# Patient Record
Sex: Male | Born: 1940 | Race: White | Hispanic: No | Smoking: Current every day smoker
Health system: Southern US, Community
[De-identification: ages and names within clinical notes are randomized; demographics above are authoritative.]

## PROBLEM LIST (undated history)

## (undated) DIAGNOSIS — I639 Cerebral infarction, unspecified: Secondary | ICD-10-CM

## (undated) DIAGNOSIS — J449 Chronic obstructive pulmonary disease, unspecified: Secondary | ICD-10-CM

## (undated) HISTORY — PX: MOHS SURGERY: SUR867

---

## 2008-12-17 ENCOUNTER — Emergency Department: Payer: Self-pay | Admitting: Emergency Medicine

## 2008-12-25 ENCOUNTER — Ambulatory Visit: Payer: Self-pay | Admitting: Surgery

## 2008-12-27 ENCOUNTER — Ambulatory Visit: Payer: Self-pay | Admitting: Surgery

## 2010-01-13 ENCOUNTER — Emergency Department: Payer: Self-pay | Admitting: Emergency Medicine

## 2010-09-07 ENCOUNTER — Ambulatory Visit: Payer: Self-pay | Admitting: Internal Medicine

## 2010-09-23 ENCOUNTER — Ambulatory Visit: Payer: Self-pay | Admitting: Internal Medicine

## 2011-08-20 ENCOUNTER — Emergency Department: Payer: Self-pay | Admitting: Emergency Medicine

## 2011-08-20 LAB — URINALYSIS, COMPLETE
Bacteria: NONE SEEN
Bilirubin,UR: NEGATIVE
Glucose,UR: NEGATIVE mg/dL (ref 0–75)
Ketone: NEGATIVE
Leukocyte Esterase: NEGATIVE
Protein: NEGATIVE
RBC,UR: NONE SEEN /HPF (ref 0–5)
Specific Gravity: 1.001 (ref 1.003–1.030)
Squamous Epithelial: NONE SEEN
WBC UR: NONE SEEN /HPF (ref 0–5)

## 2011-08-20 LAB — CBC WITH DIFFERENTIAL/PLATELET
Basophil #: 0.3 10*3/uL — ABNORMAL HIGH (ref 0.0–0.1)
HCT: 46.4 % (ref 40.0–52.0)
HGB: 15.2 g/dL (ref 13.0–18.0)
Lymphocyte #: 3.7 10*3/uL — ABNORMAL HIGH (ref 1.0–3.6)
Lymphocyte %: 33.2 %
MCH: 32.9 pg (ref 26.0–34.0)
MCV: 101 fL — ABNORMAL HIGH (ref 80–100)
Monocyte #: 1.1 x10 3/mm — ABNORMAL HIGH (ref 0.2–1.0)
Monocyte %: 10 %
Neutrophil #: 5.7 10*3/uL (ref 1.4–6.5)
Platelet: 333 10*3/uL (ref 150–440)
RDW: 13 % (ref 11.5–14.5)
WBC: 11.1 10*3/uL — ABNORMAL HIGH (ref 3.8–10.6)

## 2011-08-20 LAB — CK TOTAL AND CKMB (NOT AT ARMC)
CK, Total: 46 U/L (ref 35–232)
CK-MB: <0.5 ng/mL — ABNORMAL LOW (ref 0.5–3.6)

## 2011-08-20 LAB — COMPREHENSIVE METABOLIC PANEL
Albumin: 3.9 g/dL (ref 3.4–5.0)
Alkaline Phosphatase: 104 U/L (ref 50–136)
Anion Gap: 11 (ref 7–16)
Calcium, Total: 8.3 mg/dL — ABNORMAL LOW (ref 8.5–10.1)
EGFR (African American): >60
Osmolality: 276 (ref 275–301)
SGOT(AST): 53 U/L — ABNORMAL HIGH (ref 15–37)
SGPT (ALT): 40 U/L
Sodium: 140 mmol/L (ref 136–145)

## 2011-08-20 LAB — TROPONIN I: Troponin-I: <0.02 ng/mL

## 2011-08-20 LAB — PROTIME-INR: Prothrombin Time: 12.4 secs (ref 11.5–14.7)

## 2014-08-16 ENCOUNTER — Ambulatory Visit
Admission: RE | Admit: 2014-08-16 | Discharge: 2014-08-16 | Disposition: A | Discharge status: Home / Self Care | Payer: Medicare Other | Source: Ambulatory Visit | Attending: Internal Medicine | Admitting: Internal Medicine

## 2014-08-16 ENCOUNTER — Other Ambulatory Visit: Payer: Self-pay | Admitting: Internal Medicine

## 2014-08-16 DIAGNOSIS — R059 Cough, unspecified: Secondary | ICD-10-CM

## 2014-08-16 DIAGNOSIS — J449 Chronic obstructive pulmonary disease, unspecified: Secondary | ICD-10-CM | POA: Diagnosis present

## 2014-08-16 DIAGNOSIS — Z8709 Personal history of other diseases of the respiratory system: Secondary | ICD-10-CM

## 2014-08-16 DIAGNOSIS — F17200 Nicotine dependence, unspecified, uncomplicated: Secondary | ICD-10-CM | POA: Diagnosis present

## 2014-08-16 DIAGNOSIS — R05 Cough: Secondary | ICD-10-CM

## 2014-09-03 ENCOUNTER — Other Ambulatory Visit: Payer: Self-pay | Admitting: Internal Medicine

## 2014-09-03 DIAGNOSIS — T578X1A Toxic effect of other specified inorganic substances, accidental (unintentional), initial encounter: Secondary | ICD-10-CM

## 2014-09-04 ENCOUNTER — Other Ambulatory Visit: Payer: Self-pay | Admitting: Physician Assistant

## 2014-09-04 DIAGNOSIS — T578X1A Toxic effect of other specified inorganic substances, accidental (unintentional), initial encounter: Secondary | ICD-10-CM

## 2014-09-06 ENCOUNTER — Ambulatory Visit
Admission: RE | Admit: 2014-09-06 | Discharge: 2014-09-06 | Disposition: A | Discharge status: Home / Self Care | Payer: Medicare Other | Source: Ambulatory Visit | Attending: Internal Medicine | Admitting: Internal Medicine

## 2014-09-06 ENCOUNTER — Ambulatory Visit: Admit: 2014-09-06 | Payer: Self-pay

## 2014-09-06 ENCOUNTER — Other Ambulatory Visit
Admission: RE | Admit: 2014-09-06 | Discharge: 2014-09-06 | Disposition: A | Discharge status: Home / Self Care | Payer: Medicare Other | Source: Ambulatory Visit | Attending: Physician Assistant | Admitting: Physician Assistant

## 2014-09-06 DIAGNOSIS — T578X1A Toxic effect of other specified inorganic substances, accidental (unintentional), initial encounter: Secondary | ICD-10-CM

## 2014-09-06 DIAGNOSIS — I2510 Atherosclerotic heart disease of native coronary artery without angina pectoris: Secondary | ICD-10-CM | POA: Diagnosis not present

## 2014-09-06 DIAGNOSIS — J439 Emphysema, unspecified: Secondary | ICD-10-CM | POA: Insufficient documentation

## 2014-09-06 DIAGNOSIS — R0602 Shortness of breath: Secondary | ICD-10-CM | POA: Diagnosis present

## 2014-09-06 LAB — CREATININE, SERUM: CREATININE: 1.02 mg/dL (ref 0.61–1.24)

## 2014-09-06 MED ORDER — IOHEXOL 300 MG/ML  SOLN
75.0000 mL | Freq: Once | INTRAMUSCULAR | Status: AC | PRN
  Administered 2014-09-06: 75 mL via INTRAVENOUS

## 2014-10-04 ENCOUNTER — Other Ambulatory Visit: Payer: Self-pay | Admitting: Physician Assistant

## 2014-10-04 DIAGNOSIS — R0602 Shortness of breath: Secondary | ICD-10-CM

## 2014-10-08 ENCOUNTER — Ambulatory Visit: Admission: RE | Admit: 2014-10-08 | Payer: Medicare Other | Source: Ambulatory Visit

## 2014-10-19 ENCOUNTER — Ambulatory Visit
Admission: RE | Admit: 2014-10-19 | Discharge: 2014-10-19 | Disposition: A | Discharge status: Home / Self Care | Payer: Medicare Other | Source: Ambulatory Visit | Attending: Physician Assistant | Admitting: Physician Assistant

## 2014-10-19 DIAGNOSIS — R0602 Shortness of breath: Secondary | ICD-10-CM

## 2014-10-19 MED ORDER — REGADENOSON 0.4 MG/5ML IV SOLN
0.4000 mg | Freq: Once | INTRAVENOUS | Status: DC
  Filled 2014-10-19: qty 5

## 2014-10-19 MED ORDER — TECHNETIUM TC 99M SESTAMIBI - CARDIOLITE: 30.0000 | Freq: Once | INTRAVENOUS | Status: AC | PRN

## 2014-10-19 MED ORDER — TECHNETIUM TC 99M SESTAMIBI - CARDIOLITE: 13.0000 | Freq: Once | INTRAVENOUS | Status: AC | PRN

## 2014-10-25 ENCOUNTER — Encounter
Admission: RE | Admit: 2014-10-25 | Discharge: 2014-10-25 | Disposition: A | Discharge status: Home / Self Care | Payer: Medicare Other | Source: Ambulatory Visit | Attending: Physician Assistant | Admitting: Physician Assistant

## 2014-10-25 DIAGNOSIS — R0602 Shortness of breath: Secondary | ICD-10-CM | POA: Diagnosis present

## 2014-10-25 DIAGNOSIS — I2510 Atherosclerotic heart disease of native coronary artery without angina pectoris: Secondary | ICD-10-CM | POA: Insufficient documentation

## 2014-10-25 HISTORY — DX: Cerebral infarction, unspecified: I63.9

## 2014-10-25 MED ORDER — REGADENOSON 0.4 MG/5ML IV SOLN
0.4000 mg | Freq: Once | INTRAVENOUS | Status: DC
  Filled 2014-10-25: qty 5

## 2014-10-25 MED ORDER — TECHNETIUM TC 99M SESTAMIBI - CARDIOLITE
10.0000 | Freq: Once | INTRAVENOUS | Status: AC | PRN
  Administered 2014-10-25: 13.61 via INTRAVENOUS

## 2014-10-25 MED ORDER — TECHNETIUM TC 99M SESTAMIBI - CARDIOLITE
30.0000 | Freq: Once | INTRAVENOUS | Status: AC | PRN
  Administered 2014-10-25: 10:00:00 32.15 via INTRAVENOUS

## 2014-11-12 LAB — NM MYOCAR MULTI W/SPECT W/WALL MOTION / EF
CHL CUP NUCLEAR SDS: 3
LVDIAVOL: 49 mL
LVSYSVOL: 20 mL
SRS: 1
SSS: 3
TID: 1

## 2017-03-09 ENCOUNTER — Ambulatory Visit: Payer: Self-pay | Admitting: Nurse Practitioner

## 2019-04-23 ENCOUNTER — Emergency Department: Payer: Medicare Other

## 2019-04-23 ENCOUNTER — Other Ambulatory Visit: Payer: Self-pay

## 2019-04-23 ENCOUNTER — Inpatient Hospital Stay
Admission: EM | Admit: 2019-04-23 | Discharge: 2019-05-15 | DRG: 871 | Disposition: E | Payer: Medicare Other | Attending: Internal Medicine | Admitting: Internal Medicine

## 2019-04-23 ENCOUNTER — Inpatient Hospital Stay: Payer: Medicare Other

## 2019-04-23 DIAGNOSIS — E860 Dehydration: Secondary | ICD-10-CM | POA: Diagnosis present

## 2019-04-23 DIAGNOSIS — J189 Pneumonia, unspecified organism: Secondary | ICD-10-CM | POA: Diagnosis present

## 2019-04-23 DIAGNOSIS — I69354 Hemiplegia and hemiparesis following cerebral infarction affecting left non-dominant side: Secondary | ICD-10-CM

## 2019-04-23 DIAGNOSIS — I469 Cardiac arrest, cause unspecified: Secondary | ICD-10-CM | POA: Diagnosis not present

## 2019-04-23 DIAGNOSIS — J441 Chronic obstructive pulmonary disease with (acute) exacerbation: Secondary | ICD-10-CM | POA: Diagnosis present

## 2019-04-23 DIAGNOSIS — A419 Sepsis, unspecified organism: Secondary | ICD-10-CM | POA: Diagnosis present

## 2019-04-23 DIAGNOSIS — N179 Acute kidney failure, unspecified: Secondary | ICD-10-CM | POA: Diagnosis present

## 2019-04-23 DIAGNOSIS — Z20822 Contact with and (suspected) exposure to covid-19: Secondary | ICD-10-CM | POA: Diagnosis present

## 2019-04-23 DIAGNOSIS — I4581 Long QT syndrome: Secondary | ICD-10-CM | POA: Diagnosis present

## 2019-04-23 DIAGNOSIS — R6520 Severe sepsis without septic shock: Secondary | ICD-10-CM | POA: Diagnosis present

## 2019-04-23 DIAGNOSIS — Z79899 Other long term (current) drug therapy: Secondary | ICD-10-CM | POA: Diagnosis not present

## 2019-04-23 DIAGNOSIS — J440 Chronic obstructive pulmonary disease with acute lower respiratory infection: Secondary | ICD-10-CM | POA: Diagnosis present

## 2019-04-23 DIAGNOSIS — F17210 Nicotine dependence, cigarettes, uncomplicated: Secondary | ICD-10-CM | POA: Diagnosis present

## 2019-04-23 DIAGNOSIS — R0602 Shortness of breath: Secondary | ICD-10-CM | POA: Diagnosis present

## 2019-04-23 DIAGNOSIS — J9602 Acute respiratory failure with hypercapnia: Secondary | ICD-10-CM | POA: Diagnosis present

## 2019-04-23 DIAGNOSIS — J9601 Acute respiratory failure with hypoxia: Secondary | ICD-10-CM | POA: Diagnosis present

## 2019-04-23 HISTORY — DX: Chronic obstructive pulmonary disease, unspecified: J44.9

## 2019-04-23 LAB — BLOOD GAS, VENOUS
Acid-base deficit: 0.4 mmol/L (ref 0.0–2.0)
Bicarbonate: 25.9 mmol/L (ref 20.0–28.0)
O2 Saturation: 71.2 %
Patient temperature: 37
pCO2, Ven: 48 mmHg (ref 44.0–60.0)
pH, Ven: 7.34 (ref 7.250–7.430)
pO2, Ven: 40 mmHg (ref 32.0–45.0)

## 2019-04-23 LAB — APTT: aPTT: 33 seconds (ref 24–36)

## 2019-04-23 LAB — COMPREHENSIVE METABOLIC PANEL
ALT: 25 U/L (ref 0–44)
AST: 60 U/L — ABNORMAL HIGH (ref 15–41)
Albumin: 2.4 g/dL — ABNORMAL LOW (ref 3.5–5.0)
Alkaline Phosphatase: 85 U/L (ref 38–126)
Anion gap: 20 — ABNORMAL HIGH (ref 5–15)
BUN: 51 mg/dL — ABNORMAL HIGH (ref 8–23)
CO2: 22 mmol/L (ref 22–32)
Calcium: 9.1 mg/dL (ref 8.9–10.3)
Chloride: 93 mmol/L — ABNORMAL LOW (ref 98–111)
Creatinine, Ser: 1.32 mg/dL — ABNORMAL HIGH (ref 0.61–1.24)
GFR calc Af Amer: 59 mL/min — ABNORMAL LOW (ref >60)
GFR calc non Af Amer: 51 mL/min — ABNORMAL LOW (ref >60)
Glucose, Bld: 172 mg/dL — ABNORMAL HIGH (ref 70–99)
Potassium: 4.4 mmol/L (ref 3.5–5.1)
Sodium: 135 mmol/L (ref 135–145)
Total Bilirubin: 0.8 mg/dL (ref 0.3–1.2)
Total Protein: 8 g/dL (ref 6.5–8.1)

## 2019-04-23 LAB — FIBRIN DERIVATIVES D-DIMER (ARMC ONLY): Fibrin derivatives D-dimer (ARMC): 1507.99 ng/mL (FEU) — ABNORMAL HIGH (ref 0.00–499.00)

## 2019-04-23 LAB — BLOOD GAS, ARTERIAL
Acid-base deficit: 16.8 mmol/L — ABNORMAL HIGH (ref 0.0–2.0)
Allens test (pass/fail): POSITIVE — AB
Bicarbonate: 13.8 mmol/L — ABNORMAL LOW (ref 20.0–28.0)
FIO2: 100
MECHVT: 500 mL
O2 Saturation: 99.9 %
PEEP: 10 cmH2O
Patient temperature: 37
RATE: 20 resp/min
pCO2 arterial: 51 mmHg — ABNORMAL HIGH (ref 32.0–48.0)
pH, Arterial: 7.04 — CL (ref 7.350–7.450)
pO2, Arterial: 359 mmHg — ABNORMAL HIGH (ref 83.0–108.0)

## 2019-04-23 LAB — CBC WITH DIFFERENTIAL/PLATELET
Abs Immature Granulocytes: 0.19 10*3/uL — ABNORMAL HIGH (ref 0.00–0.07)
Basophils Absolute: 0 10*3/uL (ref 0.0–0.1)
Basophils Relative: 0 %
Eosinophils Absolute: 0.1 10*3/uL (ref 0.0–0.5)
Eosinophils Relative: 1 %
HCT: 43.8 % (ref 39.0–52.0)
Hemoglobin: 14.8 g/dL (ref 13.0–17.0)
Immature Granulocytes: 1 %
Lymphocytes Relative: 3 %
Lymphs Abs: 0.6 10*3/uL — ABNORMAL LOW (ref 0.7–4.0)
MCH: 31.5 pg (ref 26.0–34.0)
MCHC: 33.8 g/dL (ref 30.0–36.0)
MCV: 93.2 fL (ref 80.0–100.0)
Monocytes Absolute: 1.2 10*3/uL — ABNORMAL HIGH (ref 0.1–1.0)
Monocytes Relative: 5 %
Neutro Abs: 21.6 10*3/uL — ABNORMAL HIGH (ref 1.7–7.7)
Neutrophils Relative %: 90 %
Platelets: 707 10*3/uL — ABNORMAL HIGH (ref 150–400)
RBC: 4.7 MIL/uL (ref 4.22–5.81)
RDW: 13 % (ref 11.5–15.5)
Smear Review: NORMAL
WBC: 23.8 10*3/uL — ABNORMAL HIGH (ref 4.0–10.5)
nRBC: 0 % (ref 0.0–0.2)

## 2019-04-23 LAB — FERRITIN: Ferritin: 294 ng/mL (ref 24–336)

## 2019-04-23 LAB — FIBRINOGEN: Fibrinogen: >750 mg/dL — ABNORMAL HIGH (ref 210–475)

## 2019-04-23 LAB — RESPIRATORY PANEL BY RT PCR (FLU A&B, COVID)
Influenza A by PCR: NEGATIVE
Influenza B by PCR: NEGATIVE
SARS Coronavirus 2 by RT PCR: NEGATIVE

## 2019-04-23 LAB — POC SARS CORONAVIRUS 2 AG: SARS Coronavirus 2 Ag: NEGATIVE

## 2019-04-23 LAB — PROCALCITONIN: Procalcitonin: 6.22 ng/mL

## 2019-04-23 LAB — LACTIC ACID, PLASMA
Lactic Acid, Venous: 5.7 mmol/L (ref 0.5–1.9)
Lactic Acid, Venous: 3.2 mmol/L (ref 0.5–1.9)

## 2019-04-23 LAB — PROTIME-INR
INR: 1.2 (ref 0.8–1.2)
Prothrombin Time: 14.6 seconds (ref 11.4–15.2)

## 2019-04-23 LAB — C-REACTIVE PROTEIN: CRP: 22.6 mg/dL — ABNORMAL HIGH (ref <1.0)

## 2019-04-23 MED ORDER — FENTANYL CITRATE (PF) 100 MCG/2ML IJ SOLN: 25.0000 ug | Freq: Once | INTRAMUSCULAR | Status: DC

## 2019-04-23 MED ORDER — VECURONIUM BROMIDE 10 MG IV SOLR: 10.0000 mg | INTRAVENOUS | Status: DC | PRN

## 2019-04-23 MED ORDER — IPRATROPIUM-ALBUTEROL 0.5-2.5 (3) MG/3ML IN SOLN
3.0000 mL | Freq: Four times a day (QID) | RESPIRATORY_TRACT | Status: DC
  Administered 2019-04-23: 3 mL via RESPIRATORY_TRACT
  Filled 2019-04-23: qty 3

## 2019-04-23 MED ORDER — CHLORHEXIDINE GLUCONATE 0.12% ORAL RINSE (MEDLINE KIT): 15.0000 mL | Freq: Two times a day (BID) | OROMUCOSAL | Status: DC

## 2019-04-23 MED ORDER — POLYETHYLENE GLYCOL 3350 17 G PO PACK: 17.0000 g | PACK | Freq: Every day | ORAL | Status: DC | PRN

## 2019-04-23 MED ORDER — IPRATROPIUM-ALBUTEROL 0.5-2.5 (3) MG/3ML IN SOLN: 3.0000 mL | RESPIRATORY_TRACT | Status: DC

## 2019-04-23 MED ORDER — ONDANSETRON HCL 4 MG PO TABS: 4.0000 mg | ORAL_TABLET | Freq: Four times a day (QID) | ORAL | Status: DC | PRN

## 2019-04-23 MED ORDER — IOHEXOL 350 MG/ML SOLN
75.0000 mL | Freq: Once | INTRAVENOUS | Status: AC | PRN
  Administered 2019-04-23: 75 mL via INTRAVENOUS

## 2019-04-23 MED ORDER — FENTANYL 2500MCG IN NS 250ML (10MCG/ML) PREMIX INFUSION: 25.0000 ug/h | INTRAVENOUS | Status: DC

## 2019-04-23 MED ORDER — SODIUM CHLORIDE 0.9 % IV SOLN
2.0000 g | INTRAVENOUS | Status: DC
  Administered 2019-04-23: 2 g via INTRAVENOUS
  Filled 2019-04-23 (×2): qty 20

## 2019-04-23 MED ORDER — FENTANYL BOLUS VIA INFUSION
25.0000 ug | INTRAVENOUS | Status: DC | PRN
  Filled 2019-04-23: qty 25

## 2019-04-23 MED ORDER — ENOXAPARIN SODIUM 40 MG/0.4ML ~~LOC~~ SOLN: 40.0000 mg | SUBCUTANEOUS | Status: DC

## 2019-04-23 MED ORDER — ORAL CARE MOUTH RINSE: 15.0000 mL | OROMUCOSAL | Status: DC

## 2019-04-23 MED ORDER — SODIUM CHLORIDE 0.9 % IV BOLUS
1000.0000 mL | Freq: Once | INTRAVENOUS | Status: AC
  Administered 2019-04-23: 1000 mL via INTRAVENOUS

## 2019-04-23 MED ORDER — ASPIRIN EC 81 MG PO TBEC
81.0000 mg | DELAYED_RELEASE_TABLET | Freq: Every day | ORAL | Status: DC
  Administered 2019-04-23: 81 mg via ORAL
  Filled 2019-04-23: qty 1

## 2019-04-23 MED ORDER — ACETAMINOPHEN 325 MG PO TABS: 650.0000 mg | ORAL_TABLET | Freq: Four times a day (QID) | ORAL | Status: DC | PRN

## 2019-04-23 MED ORDER — IOHEXOL 300 MG/ML  SOLN: 75.0000 mL | Freq: Once | INTRAMUSCULAR | Status: DC | PRN

## 2019-04-23 MED ORDER — ACETAMINOPHEN 650 MG RE SUPP: 650.0000 mg | Freq: Four times a day (QID) | RECTAL | Status: DC | PRN

## 2019-04-23 MED ORDER — ONDANSETRON HCL 4 MG/2ML IJ SOLN: 4.0000 mg | Freq: Four times a day (QID) | INTRAMUSCULAR | Status: DC | PRN

## 2019-04-23 MED ORDER — FAMOTIDINE IN NACL 20-0.9 MG/50ML-% IV SOLN: 20.0000 mg | Freq: Two times a day (BID) | INTRAVENOUS | Status: DC

## 2019-04-23 MED ORDER — SODIUM CHLORIDE 0.9 % IV SOLN: INTRAVENOUS | Status: DC

## 2019-04-23 MED ORDER — PROPOFOL 1000 MG/100ML IV EMUL
INTRAVENOUS | Status: AC
  Administered 2019-04-23: 20 ug/kg/min via INTRAVENOUS
  Filled 2019-04-23: qty 100

## 2019-04-23 MED ORDER — SUCCINYLCHOLINE CHLORIDE 20 MG/ML IJ SOLN
INTRAMUSCULAR | Status: DC | PRN
  Administered 2019-04-23: 100 mg via INTRAVENOUS

## 2019-04-23 MED ORDER — BUDESONIDE 0.5 MG/2ML IN SUSP: 0.5000 mg | Freq: Two times a day (BID) | RESPIRATORY_TRACT | Status: DC

## 2019-04-23 MED ORDER — SODIUM CHLORIDE 0.9 % IV SOLN
500.0000 mg | INTRAVENOUS | Status: DC
  Administered 2019-04-23: 500 mg via INTRAVENOUS
  Filled 2019-04-23 (×2): qty 500

## 2019-04-23 MED ORDER — LORAZEPAM 2 MG/ML IJ SOLN: 2.0000 mg | INTRAMUSCULAR | Status: DC | PRN

## 2019-04-23 MED ORDER — ETOMIDATE 2 MG/ML IV SOLN
INTRAVENOUS | Status: DC | PRN
  Administered 2019-04-23: 20 mg via INTRAVENOUS

## 2019-04-23 MED ORDER — METHYLPREDNISOLONE SODIUM SUCC 40 MG IJ SOLR: 40.0000 mg | Freq: Two times a day (BID) | INTRAMUSCULAR | Status: DC

## 2019-04-23 MED ORDER — PROPOFOL 1000 MG/100ML IV EMUL: 5.0000 ug/kg/min | INTRAVENOUS | Status: DC

## 2019-04-25 LAB — CULTURE, BLOOD (ROUTINE X 2): Special Requests: ADEQUATE

## 2019-04-28 LAB — CULTURE, BLOOD (ROUTINE X 2): Culture: NO GROWTH

## 2019-05-15 NOTE — Progress Notes (Signed)
CODE SEPSIS - PHARMACY COMMUNICATION  **Broad Spectrum Antibiotics should be administered within 1 hour of Sepsis diagnosis**  Time Code Sepsis Called/Page Received: 3662  Antibiotics Ordered: Rocephin/azithro  Time of 1st antibiotic administration: 1225 (antibiotics ordered 1145)  Additional action taken by pharmacy: NA  If necessary, Name of Provider/Nurse Contacted: NA    Pricilla Riffle ,PharmD Clinical Pharmacist  04-30-19  12:30 PM

## 2019-05-15 NOTE — Progress Notes (Signed)
   May 11, 2019 1756  Clinical Encounter Type  Visited With Patient and family together  Visit Type Initial  Referral From Nurse  Consult/Referral To Chaplain  Spiritual Encounters  Spiritual Needs Prayer;Emotional;Grief support  [facility] received page for patient who died shortly after arriving on floor. [place] met pt's daughter in hallway and walked with her to the deceased room. CH assisted in bringing other members of support system to room as well. Family was grieving appropriately. Family chose [facility] in [place]. Provided pastoral care through [employer] of presence, validating feelings and prayer upon request. No further needs at this time.

## 2019-05-15 NOTE — ED Provider Notes (Addendum)
[facility] Emergency Department Provider Note  Time seen: 9:56 AM  I have reviewed the triage vital signs and the nursing notes.   HISTORY  Chief Complaint Respiratory Distress   HPI Jamie Hopkins is a 79 y.o. male with a past medical history of COPD presents to the emergency department for worsening shortness of breath and cough.  According to the patient over the past 2 weeks or so has had progressively worsening shortness of breath which has acutely worsened over the past 24 hours.  EMS states upon arrival they placed the patient on 6 L of oxygen satting in the low 90s on 6 L.  No reported fever.  Patient states he has had an increased cough.  Audible rhonchi.  Patient denies any chest pain or abdominal pain.  Sitting upright in bed moderate respiratory distress quite tachypneic.  Past Medical History:  Diagnosis Date  . Stroke    hiastory of mini strokes, left side weakened    There are no problems to display for this patient.   Prior to Admission medications   Not on File    No Known Allergies  No family history on file.  Social History Social History   Tobacco Use  . Smoking status: Current Every Day Smoker    Packs/day: 1.00    Years: 60.00    Pack years: 60.00  Substance Use Topics  . Alcohol use: Yes    Alcohol/week: 10.0 standard drinks    Types: 10 Cans of beer per week  . Drug use: Not on file    Review of Systems Constitutional: Negative for fever. Cardiovascular: Negative for chest pain. Respiratory: Positive for shortness of breath.  Positive for cough. Gastrointestinal: Negative for abdominal pain Musculoskeletal: Negative for leg swelling Neurological: Negative for headache All other ROS negative  ____________________________________________   PHYSICAL EXAM:  VITAL SIGNS: ED Triage Vitals  Enc Vitals Group     BP Apr 26, 2019 0948 (!) 147/66     Pulse Rate 2019-04-26 0948 (!) 142     Resp April 26, 2019 0948 (!) 38      Temp Apr 26, 2019 0948 98.9 F (37.2 C)     Temp Source 04/26/2019 0948 Oral     SpO2 Apr 26, 2019 0948 96 %     Weight April 26, 2019 0953 159 lb (72.1 kg)     Height 04/26/2019 0953 6\' 1"  (1.854 m)     Head Circumference --      Peak Flow --      Pain Score 26-Apr-2019 0952 9     Pain Loc --      Pain Edu? --      Excl. in GC? --    Constitutional: Patient is awake alert oriented, moderate respiratory distress sitting upright in bed with audible rhonchi. Eyes: Normal exam ENT      Head: Normocephalic and atraumatic.      Mouth/Throat: Mucous membranes are moist. Cardiovascular: Regular rhythm rate around 150 bpm.  No obvious murmur. Respiratory: Moderate tachypnea with mild to moderate distress sitting upright in bed.  Rhonchi auscultated bilaterally. Gastrointestinal: Soft and nontender. No distention. Musculoskeletal: Nontender with normal range of motion in all extremities. No lower extremity tenderness or edema. Neurologic:  Normal speech and language. No gross focal neurologic deficits  Skin:  Skin is warm, dry and intact.  Psychiatric: Mood and affect are normal.   ____________________________________________    EKG  EKG viewed and interpreted by myself shows what appears to be a sinus tachycardia around 144 bpm  with a narrow QRS, normal axis, normal intervals besides slight QTC prolongation nonspecific ST changes.  ____________________________________________    RADIOLOGY  Chest x-ray shows right upper lobe consolidation, cannot rule out mass we will obtain CT to further evaluate  ____________________________________________   INITIAL IMPRESSION / ASSESSMENT AND PLAN / ED COURSE  Pertinent labs & imaging results that were available during my care of the patient were reviewed by me and considered in my medical decision making (see chart for details).   Patient presents to the emergency department with moderate respiratory distress sitting upright in bed with audible rhonchi.   Patient received 125 mg of Solu-Medrol, 2 duo nebs and 2 g of magnesium by EMS prior to arrival.  We will continue to closely monitor in the emergency department.  We will check labs including blood cultures we will obtain a portable chest x-ray, perform a rapid Covid swab.  Given the patient's moderate respiratory distress audible rhonchi patient placed on BiPAP upon arrival.  Patient is afebrile in the emergency department.  Labs show significant leukocytosis consistent with sepsis given elevated respiratory rate and heart rate.  I have ordered broad-spectrum antibiotics to cover the patient.  I have also ordered a CT scan of the chest to rule out mass and to better evaluate the consolidation.  Rapid/PCR Covid test pending.  Patient admitted to the hospital service.  ----------------------------------------- 3:51 PM on 05-06-19 -----------------------------------------  Patient has acutely decompensated, has become more tachypneic, less responsive unable to answer questions or follow commands.  Placed back on BiPAP while preparing intubation.  Patient intubated via RSI without complication.  We will repeat a chest x-ray to evaluate as the patient continues to have a low oxygen saturation 61% however not a great waveform.  We will obtain an ABG.  I was able to clearly visualize the tube going between the vocal cords, equal breath sounds, bagging well with good color change.    Jamie Hopkins was evaluated in Emergency Department on 05/06/2019 for the symptoms described in the history of present illness. He was evaluated in the context of the global COVID-19 pandemic, which necessitated consideration that the patient might be at risk for infection with the SARS-CoV-2 virus that causes COVID-19. Institutional protocols and algorithms that pertain to the evaluation of patients at risk for COVID-19 are in a state of rapid change based on information released by regulatory bodies including the CDC and  federal and state organizations. These policies and algorithms were followed during the patient's care in the ED.  CRITICAL CARE Performed by: [facility]   Total critical care time: 60 minutes  Critical care time was exclusive of separately billable procedures and treating other patients.  Critical care was necessary to treat or prevent imminent or life-threatening deterioration.  Critical care was time spent personally by me on the following activities: development of treatment plan with patient and/or surrogate as well as nursing, discussions with consultants, evaluation of patient's response to treatment, examination of patient, obtaining history from patient or surrogate, ordering and performing treatments and interventions, ordering and review of laboratory studies, ordering and review of radiographic studies, pulse oximetry and re-evaluation of patient's condition.  INTUBATION Performed by: [facility]  Required items: required blood products, implants, devices, and special equipment available Patient identity confirmed: provided demographic data and hospital-assigned identification number Time out: Immediately prior to procedure a "time out" was called to verify the correct patient, procedure, equipment, support staff and site/side marked as required.  Indications: Airway failure  Intubation method: S4 Glidescope Laryngoscopy   Preoxygenation: 100 %BVM  Sedatives: 20mg  Etomidate Paralytic: 100mg  Succinylcholine  Tube Size: 7.5 cuffed  Post-procedure assessment: chest rise and ETCO2 monitor Breath sounds: equal and absent over the epigastrium Tube secured with: ETT holder  Patient tolerated the procedure well with no immediate complications.     ____________________________________________   FINAL CLINICAL IMPRESSION(S) / ED DIAGNOSES  Pneumonia Respiratory failure Sepsis   , MD 2019/05/05 1201    Minna Antis,  MD [id number]

## 2019-05-15 NOTE — Progress Notes (Signed)
Assisted with patient transfer to and from CT scan and once back in room patient placed on 3l [place] to assess off of bipap.

## 2019-05-15 NOTE — ED Notes (Signed)
Pt oxygen level on 3L was 88%. Increased to 6 L , will continue to monitor.

## 2019-05-15 NOTE — ED Notes (Signed)
Upon removing pt pants, pt had on jeans, long johns, and boxers. Brown/black stool noted to boxers and on pt. Pt cleaned with wipes. Strong ammonia smell. Cleaned with wipes front and back. Skin breakdown and redness noted to scrotum.   Pt other clothes- 2 pairs of socks, 3 shirts. All clothes placed in 2 belongings bags.

## 2019-05-15 NOTE — ED Notes (Signed)
This RN and RT are taking pt to CT room 1 at this time.

## 2019-05-15 NOTE — ED Notes (Signed)
Pt placed on bipap upon arrival. Pt using accessory muscles, labored breathing, breathing about 40 times a minute.

## 2019-05-15 NOTE — ED Notes (Signed)
Pt being placed back on bipap at this time.

## 2019-05-15 NOTE — ED Notes (Signed)
Admitting MD at bedside.

## 2019-05-15 NOTE — ED Triage Notes (Signed)
Pt arrives [facility] from home with resp distress. Hx COPD. Was 92% RA. Received 3 dounebs, 125mg  solumedrol, 2g magnesium. Pt was placed on 6 L [place] d/t dropping sats enroute. Arrives A&O. Coughing present, denies fever.

## 2019-05-15 NOTE — ED Notes (Signed)
Date and time results received: 05/10/19 1105  Test: lactic acid Critical Value: 5.7  Name of Provider Notified: Dr. Lenard Lance

## 2019-05-15 NOTE — H&P (Signed)
History and Physical:    Jamie Hopkins   CBJ:[id number] DOB: Apr 30, 1940 DOA: 05/05/2019  Referring MD/provider:  PCP: Derwood Kaplan, MD   Patient coming from: Dr. Minna Antis  Chief Complaint: Shortness of breath  History of Present Illness:   Jamie Hopkins is an 79 y.o. male with medical history significant for COPD, stroke, who presented to the hospital with shortness of breath.  He said he has been feeling short of breath for about 2 to 3 weeks now.  This has been associated with a cough.  He said he had been "stubborn" and did not want to come to the hospital for evaluation.  However, symptoms progressed soon EMS was called and he was brought to the ER for further evaluation.  He said he had 2 watery stools yesterday and also vomited this morning.  No fever, chills, chest pain, abdominal pain or any urinary symptoms.  He said he has COPD and used to take inhalers but he does not take anything.  He was given IV magnesium, IV Solu-Medrol (125 mg) and DuoNeb (3 rounds) and placed on 6 L of oxygen by EMS  ED Course:  The patient was afebrile but tachypneic, tachycardic and hypoxemic.  Later he got to the ED, his oxygen saturation was 90% on 6 L.  Patient was placed on BiPAP for increased work of breathing.  Lactic acid was 5.7, procalcitonin was 6.22, WBC 23.8 D-dimer 1,507 and chest x-ray showed right upper lobe pneumonia.  He was given 2 L of normal saline, IV Rocephin and azithromycin in the emergency room.  Rapid Covid test was negative.  ROS:   ROS all other systems reviewed were negative  Past Medical History:   Past Medical History:  Diagnosis Date  . COPD (chronic obstructive pulmonary disease) (HCC)   . Stroke [facility])    hiastory of mini strokes, left side weakened    Past Surgical History:   Past Surgical History:  Procedure Laterality Date  . MOHS SURGERY      Social History:   Social History   Socioeconomic History  . Marital status: Widowed    Spouse  name: Not on file  . Number of children: Not on file  . Years of education: Not on file  . Highest education level: Not on file  Occupational History  . Not on file  Tobacco Use  . Smoking status: Current Every Day Smoker    Packs/day: 1.00    Years: 60.00    Pack years: 60.00  Substance and Sexual Activity  . Alcohol use: Yes    Alcohol/week: 10.0 standard drinks    Types: 10 Cans of beer per week  . Drug use: Not on file  . Sexual activity: Not on file  Other Topics Concern  . Not on file  Social History Narrative  . Not on file   Social Determinants of Health   Financial Resource Strain:   . Difficulty of Paying Living Expenses: Not on file  Food Insecurity:   . Worried About Programme researcher, broadcasting/film/video in the Last Year: Not on file  . Ran Out of Food in the Last Year: Not on file  Transportation Needs:   . Lack of Transportation (Medical): Not on file  . Lack of Transportation (Non-Medical): Not on file  Physical Activity:   . Days of Exercise per Week: Not on file  . Minutes of Exercise per Session: Not on file  Stress:   . Feeling of Stress :  Not on file  Social Connections:   . Frequency of Communication with Friends and Family: Not on file  . Frequency of Social Gatherings with Friends and Family: Not on file  . Attends Religious Services: Not on file  . Active Member of Clubs or Organizations: Not on file  . Attends Banker Meetings: Not on file  . Marital Status: Not on file  Intimate Partner Violence:   . Fear of Current or Ex-Partner: Not on file  . Emotionally Abused: Not on file  . Physically Abused: Not on file  . Sexually Abused: Not on file    Allergies   Patient has no known allergies.  Family history:   History reviewed. No pertinent family history.  Current Medications:   Prior to Admission medications   Not on File    Physical Exam:   Vitals:   2019/05/22 1005 05-22-2019 1030 May 22, 2019 1100 22-May-2019 1130  BP: 123/79 119/76 (!)  124/101 131/79  Pulse: (!) 146 (!) 139 90 (!) 124  Resp: (!) 34 (!) 38 (!) 26 (!) 42  Temp:      TempSrc:      SpO2: 99% 99% 100% 100%  Weight:      Height:         Physical Exam: Blood pressure 131/79, pulse (!) 124, temperature 98.9 F (37.2 C), temperature source Oral, resp. rate (!) 42, height 6\' 1"  (1.854 m), weight 72.1 kg, SpO2 100 %. Gen: No acute distress.  He is on BiPAP and breathing better. Head: Normocephalic, atraumatic. Eyes: Pupils equal, round and reactive to light. Extraocular movements intact.  Sclerae nonicteric.  Mouth: Dry mucous membranes Neck: Supple, no thyromegaly, no lymphadenopathy, no jugular venous distention. Chest: bilateral rales with bilateral rhonchi CV: Heart sounds are regular with an S1, S2. No murmurs, rubs or gallops.  Abdomen: Soft, nontender, nondistended with normal active bowel sounds. No palpable masses. Extremities: Extremities are without clubbing, or cyanosis but he has bilateral hammertoes. No edema. Pedal pulses 2+.  Skin: Warm and dry. No rashes, lesions or wounds Neuro: Alert and oriented times 3; grossly nonfocal.  Psych: Insight is good and judgment is appropriate. Mood and affect normal.   Data Review:    Labs: Basic Metabolic Panel: Recent Labs  Lab May 22, 2019 1000  NA 135  K 4.4  CL 93*  CO2 22  GLUCOSE 172*  BUN 51*  CREATININE 1.32*  CALCIUM 9.1   Liver Function Tests: Recent Labs  Lab 22-May-2019 1000  AST 60*  ALT 25  ALKPHOS 85  BILITOT 0.8  PROT 8.0  ALBUMIN 2.4*   No results for input(s): LIPASE, AMYLASE in the last 168 hours. No results for input(s): AMMONIA in the last 168 hours. CBC: Recent Labs  Lab 05/22/2019 1000  WBC 23.8*  NEUTROABS 21.6*  HGB 14.8  HCT 43.8  MCV 93.2  PLT 707*   Cardiac Enzymes: No results for input(s): CKTOTAL, CKMB, CKMBINDEX, TROPONINI in the last 168 hours.  BNP (last 3 results) No results for input(s): PROBNP in the last 8760 hours. CBG: No results for  input(s): GLUCAP in the last 168 hours.  Urinalysis    Component Value Date/Time   COLORURINE Colorless 08/20/2011 0216   APPEARANCEUR Clear 08/20/2011 0216   LABSPEC 1.001 08/20/2011 0216   PHURINE 6.0 08/20/2011 0216   GLUCOSEU Negative 08/20/2011 0216   HGBUR Negative 08/20/2011 0216   BILIRUBINUR Negative 08/20/2011 0216   KETONESUR Negative 08/20/2011 0216   PROTEINUR Negative 08/20/2011 0216  NITRITE Negative 08/20/2011 0216   LEUKOCYTESUR Negative 08/20/2011 0216      Radiographic Studies: DG Chest Portable 1 View  Result Date: 05/13/2019 CLINICAL DATA:  Respiratory distress. EXAM: PORTABLE CHEST 1 VIEW COMPARISON:  08/16/2014 FINDINGS: 0959 hours. Right upper lobe collapse/consolidation noted with volume loss in the right hemithorax. Left lung appears hyperexpanded but clear. No pulmonary edema or pleural effusion. The cardiopericardial silhouette is within normal limits for size. The visualized bony structures of the thorax are intact. Telemetry leads overlie the chest. IMPRESSION: 1. Right upper lobe collapse/consolidation with volume loss in the right hemithorax. Chest CT with contrast recommended as central obstructing mass lesion a distinct concern. Electronically Signed   By: Misty Stanley M.D.   On: 05-13-19 10:35    EKG: Independently reviewed.    Assessment/Plan:   Principal Problem:   Sepsis ([place]) Active Problems:   Community acquired pneumonia   Acute hypoxemic respiratory failure (HCC)    Body mass index is 20.98 kg/m.    Sepsis secondary to community-acquired right upper lobe pneumonia (lactic acidosis, tachycardia, tachypneic): Admit to telemetry.  Treat with IV fluids and empiric IV antibiotics.  Repeat lactate and follow-up blood cultures.  CT chest is still pending.  Acute COPD exacerbation: Treat with IV steroids and bronchodilators  Acute hypoxemic respiratory failure: Patient was on BiPAP but he is going to be intubated (by Dr.  Kerman Passey) and placed on mechanical ventilation for worsening respiratory failure.  Dehydration with probable AKI: Continue IV fluids.  Monitor BMP.  History of stroke: Treat with aspirin  Addendum at 3:48 PM today  Patient breathing got worse on BiPAP.  He became less responsive and was very tachypneic with increased work of breathing.  I went to the bedside to reevaluate the patient.  ED physician, Dr. Kerman Passey, respiratory therapist and nurse well at the bedside.  It was decided that patient be intubated and placed on mechanical ventilation before he crashed.  Case was discussed with intensivist on-call, Dr. Mortimer Fries, and he has graciously accepted the patient in transfer to the ICU.  He will take over the care of this patient.  Other information:   DVT prophylaxis: Lovenox Code Status: Full code. Family Communication: Plan discussed with the patient Disposition Plan: Possible discharge to home in 2 to 3 days Consults called: None Admission status: Patient  The medical decision making on this patient was of high complexity and the patient is at high risk for clinical deterioration, therefore this is a level 3 visit.    Time spent 65 minutes  [place] [facility]   How to contact the [facility] Attending or Consulting provider [place] or covering provider during after hours [place], for this patient?   1. Check the care team in [facility] and look for a) attending/consulting [facility] provider listed and b) the [facility] team listed 2. Log into [email or web address] and use [place]'s universal password to access. If you do not have the password, please contact the hospital operator. 3. Locate the [facility] provider you are looking for under [facility] and page to a number that you can be directly reached. 4. If you still have difficulty reaching the provider, please page the [facility] (Director on Call) for the Hospitalists listed on amion for assistance.  May 13, 2019, 12:08 PM

## 2019-05-15 NOTE — Death Summary Note (Signed)
Name: HUTSON LUFT MRN: [id number] DOB: 11-22-1940     CONSULTATION DATE: 26-Apr-2019  REFERRING MD : Mal Misty  CHIEF COMPLAINT:Resp  failure   HISTORY OF PRESENT ILLNESS: 79 y.o. male with medical history significant for COPD, stroke, who presented to the hospital with shortness of breath -feeling short of breath for about 2 to 3 weeks now +cough +diarrhea +COPD exacerbation  ED Course:   -afebrile but tachypneic, tachycardic and hypoxemic -oxygen saturation was 90% on 6 L. -Patient was placed on BiPAP for increased work of breathing.  Lactic acid was 5.7, procalcitonin was 6.22, WBC 23.8 D-dimer 1,507 - chest x-ray showed right upper lobe pneumonia - 2 L of normal saline, IV Rocephin and azithromycin in the emergency room  Rapid Covid test was negative.  Patient had progressive del;ince in SOB and WOB Patient was emergently intubated critically ill  PAST MEDICAL HISTORY :   has a past medical history of COPD (chronic obstructive pulmonary disease) ([place]) and Stroke ([place]).  has a past surgical history that includes Mohs surgery. Prior to Admission medications   Medication Sig Start Date End Date Taking? Authorizing Provider  acetaminophen (TYLENOL) 325 MG tablet Take 650 mg by mouth every 6 (six) hours as needed.   Yes [provider]  EPINEPHrine (PRIMATENE MIST) 0.125 MG/ACT AERO Inhale into the lungs every 6 (six) hours as needed.   Yes [provider]   No Known Allergies  FAMILY HISTORY:  family history is not on file. SOCIAL HISTORY:  reports that he has been smoking. He has a 60.00 pack-year smoking history. He does not have any smokeless tobacco history on file. He reports current alcohol use of about 10.0 standard drinks of alcohol per week.  REVIEW OF SYSTEMS:   Unable to obtain due to critical illness   VITAL SIGNS: Temp:  [98.9 F (37.2 C)] 98.9 F (37.2 C) (01/10 0948) Pulse Rate:  [66-154] 121 (01/10 1621) Resp:  [17-42] 21 (01/10  1630) BP: (95-159)/(57-107) 139/95 (01/10 1630) SpO2:  [55 %-100 %] 100 % (01/10 1621) Weight:  [72.1 kg] 72.1 kg (01/10 0953)   No intake/output data recorded. Total I/O In: 2100 [IV Piggyback:2100] Out: -    SpO2: 100 % O2 Flow Rate (L/min): (S) 3 L/min   Physical Examination:  GENERAL:critically ill appearing, +resp distress HEAD: Normocephalic, atraumatic.  EYES: Pupils equal, round, reactive to light.  No scleral icterus.  MOUTH: Moist mucosal membrane. NECK: Supple. No JVD.  PULMONARY: +rhonchi, +wheezing CARDIOVASCULAR: S1 and S2. Regular rate and rhythm. No murmurs, rubs, or gallops.  GASTROINTESTINAL: Soft, nontender, -distended.  Positive bowel sounds.  MUSCULOSKELETAL: No swelling, clubbing, or edema.  NEUROLOGIC: obtunded SKIN:intact,warm,dry  I personally reviewed lab work that was obtained in last 24 hrs. CXR Independently reviewed-RUL opacity c/w pneumonia  MEDICATIONS: I have reviewed all medications and confirmed regimen as documented   CULTURE RESULTS   Recent Results (from the past 240 hour(s))  Respiratory Panel by RT PCR (Flu A&B, Covid) - Nasopharyngeal Swab     Status: None   Collection Time: April 26, 2019 11:13 AM   Specimen: Nasopharyngeal Swab  Result Value Ref Range Status   SARS Coronavirus 2 by RT PCR NEGATIVE NEGATIVE Final    Comment: (NOTE) SARS-CoV-2 target nucleic acids are NOT DETECTED. The SARS-CoV-2 RNA is generally detectable in upper respiratoy specimens during the acute phase of infection. The lowest concentration of SARS-CoV-2 viral copies this assay can detect is 131 copies/mL. A negative result does not preclude SARS-Cov-2  infection and should not be used as the sole basis for treatment or other patient management decisions. A negative result may occur with  improper specimen collection/handling, submission of specimen other than nasopharyngeal swab, presence of viral mutation(s) within the areas targeted by this assay, and  inadequate number of viral copies (<131 copies/mL). A negative result must be combined with clinical observations, patient history, and epidemiological information. The expected result is Negative. Fact Sheet for Patients:  https://www.moore.com/ Fact Sheet for Healthcare Providers:  https://www.young.biz/ This test is not yet ap proved or cleared by the [place] FDA and  has been authorized for detection and/or diagnosis of SARS-CoV-2 by FDA under an Emergency Use Authorization (EUA). This EUA will remain  in effect (meaning this test can be used) for the duration of the COVID-19 declaration under Section 564(b)(1) of the Act, 21 U.S.C. section 360bbb-3(b)(1), unless the authorization is terminated or revoked sooner.    Influenza A by PCR NEGATIVE NEGATIVE Final   Influenza B by PCR NEGATIVE NEGATIVE Final    Comment: (NOTE) The Xpert Xpress SARS-CoV-2/FLU/RSV assay is intended as an aid in  the diagnosis of influenza from Nasopharyngeal swab specimens and  should not be used as a sole basis for treatment. Nasal washings and  aspirates are unacceptable for Xpert Xpress SARS-CoV-2/FLU/RSV  testing. Fact Sheet for Patients: https://www.moore.com/ Fact Sheet for Healthcare Providers: https://www.young.biz/ This test is not yet approved or cleared by the [place] FDA and  has been authorized for detection and/or diagnosis of SARS-CoV-2 by  FDA under an Emergency Use Authorization (EUA). This EUA will remain  in effect (meaning this test can be used) for the duration of the  Covid-19 declaration under Section 564(b)(1) of the Act, 21  U.S.C. section 360bbb-3(b)(1), unless the authorization is  terminated or revoked. Performed at [facility], [address] [place], [place] [address]           IMAGING    CT ANGIO CHEST PE W OR WO CONTRAST  Addendum Date: 04-May-2019   ADDENDUM  REPORT: May 04, 2019 13:06 ADDENDUM: These results were called by telephone at the time of interpretation on 05-04-2019 at 1:06 pm to provider [facility] , who verbally acknowledged these results. Electronically Signed   By: Romona Curls M.D.   On: 05/04/19 13:06   Result Date: May 04, 2019 CLINICAL DATA:  Respiratory failure, history of COPD. EXAM: CT ANGIOGRAPHY CHEST WITH CONTRAST TECHNIQUE: Multidetector CT imaging of the chest was performed using the standard protocol during bolus administration of intravenous contrast. Multiplanar CT image reconstructions and MIPs were obtained to evaluate the vascular anatomy. CONTRAST:  92mL OMNIPAQUE IOHEXOL 350 MG/ML SOLN COMPARISON:  Chest radiograph from the same day and CT chest dated 09/06/2014. FINDINGS: Cardiovascular: Satisfactory opacification of the pulmonary arteries to the segmental level. There is occlusion of the superior trunk of the right pulmonary artery near its origin likely due to the associated cavitary mass. Otherwise, no evidence of pulmonary embolism. Normal heart size. Vascular calcifications are seen in the aortic arch. No pericardial effusion. Mediastinum/Nodes: There are several enlarged paratracheal lymph nodes. Multiple additional prominent mediastinal nodes are identified. Evaluation right hilar adenopathy is difficult due to the cavitary mass/consolidation in this area. There is no left hilar adenopathy. No enlarged axillary lymph nodes. Thyroid gland, trachea, and esophagus demonstrate no significant findings. Lungs/Pleura: There is a large cavitary lesion in the right upper lobe which measures at least 6.5 cm in diameter. This communicates directly with the right mainstem bronchus. There  is a prominent soft tissue surrounding the right mainstem bronchus which is incontinuity with the cavitary lesion. Consolidation involves the majority of the right upper lobe. Additional ground-glass nodules are seen in both lungs. There is no  pleural effusion or pneumothorax. Centrilobular emphysema is noted. Upper Abdomen: No acute abnormality. Musculoskeletal: No chest wall abnormality. No acute or significant osseous findings. Review of the MIP images confirms the above findings. IMPRESSION: 1. Large cavitary lesion/mass in the right upper lobe with direct communication to the right mainstem bronchus. Surrounding consolidation involves the majority of the right upper lobe. Additional ground-glass nodules are seen in both lungs. Mediastinal lymphadenopathy is noted. These findings are concerning for a cavitary pneumonia or a cavitary malignancy. Bronchoscopy should be considered. 2. 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 Otherwise, no evidence of pulmonary embolus. Aortic Atherosclerosis (ICD10-I70.0) and Emphysema (ICD10-J43.9). Electronically Signed: By: Romona Curls M.D. On: May 18, 2019 13:01   DG Chest Portable 1 View  Result Date: 05/18/2019 CLINICAL DATA:  New intubation. EXAM: PORTABLE CHEST 1 VIEW COMPARISON:  Chest x-ray 18-May-2019 and CT of the chest May 18, 2019 FINDINGS: An ET tube is been placed in the interval in terminates 7.2 cm above the carina, in good position. The NG tube terminates below today's film. Infiltrate in the right upper lobe is again identified. The known cavitary mass seen on recent CT scan is better appreciated with CT imaging. The left lung is clear. The cardiomediastinal silhouette is stable. IMPRESSION: 1. Support apparatus as above. 2. Continued infiltrate in the right upper lobe. The known cavitary mass was better assessed with CT imaging. Electronically Signed   By: Gerome Sam III M.D   On: May 18, 2019 16:25   DG Chest Portable 1 View  Result Date: 05-18-2019 CLINICAL DATA:  Respiratory distress. EXAM: PORTABLE CHEST 1 VIEW COMPARISON:  08/16/2014 FINDINGS: 0959 hours. Right upper lobe  collapse/consolidation noted with volume loss in the right hemithorax. Left lung appears hyperexpanded but clear. No pulmonary edema or pleural effusion. The cardiopericardial silhouette is within normal limits for size. The visualized bony structures of the thorax are intact. Telemetry leads overlie the chest. IMPRESSION: 1. Right upper lobe collapse/consolidation with volume loss in the right hemithorax. Chest CT with contrast recommended as central obstructing mass lesion a distinct concern. Electronically Signed   By: Kennith Center M.D.   On: May 18, 2019 10:35   DG Abd Portable 1 View  Result Date: 2019-05-18 CLINICAL DATA:  Evaluate OG tube placement EXAM: PORTABLE ABDOMEN - 1 VIEW COMPARISON:  None. FINDINGS: The distal tip of the NG tube is within the stomach. The side port is obscured by a transcutaneous pacer wire. IMPRESSION: The distal tip of the NG tube is in the stomach. The side port is obscured by a transcutaneous pacer wire. Electronically Signed   By: Gerome Sam III M.D   On: 2019-05-18 16:26    CBC    Component Value Date/Time   WBC 23.8 (H) 05-18-19 1000   RBC 4.70 May 18, 2019 1000   HGB 14.8 05/18/19 1000   HGB 15.2 08/20/2011 0106   HCT 43.8 18-May-2019 1000   HCT 46.4 08/20/2011 0106   PLT 707 (H) 18-May-2019 1000   PLT 333 08/20/2011 0106   MCV 93.2 05/18/19 1000   MCV 101 (H) 08/20/2011 0106   MCH 31.5 2019-05-18 1000   MCHC 33.8 May 18, 2019 1000   RDW 13.0 2019/05/18 1000  RDW 13.0 08/20/2011 0106   LYMPHSABS 0.6 (L) May 06, 2019 1000   LYMPHSABS 3.7 (H) 08/20/2011 0106   MONOABS 1.2 (H) 05/06/19 1000   MONOABS 1.1 (H) 08/20/2011 0106   EOSABS 0.1 05/06/2019 1000   EOSABS 0.3 08/20/2011 0106   BASOSABS 0.0 2019/05/06 1000   BASOSABS 0.3 (H) 08/20/2011 0106   BMP Latest Ref Rng & Units 05/06/19 09/06/2014 08/20/2011  Glucose 70 - 99 mg/dL 025(E) - 85  BUN 8 - 23 mg/dL 52(D) - 5(L)  Creatinine 0.61 - 1.24 mg/dL 7.82(U) 2.35 3.61  Sodium 135 - 145  mmol/L 135 - 140  Potassium 3.5 - 5.1 mmol/L 4.4 - 3.8  Chloride 98 - 111 mmol/L 93(L) - 103  CO2 22 - 32 mmol/L 22 - 26  Calcium 8.9 - 10.3 mg/dL 9.1 - 8.3(L)       Indwelling Urinary Catheter continued, requirement due to   Reason to continue Indwelling Urinary Catheter strict Intake/Output monitoring for hemodynamic instability         Ventilator continued, requirement due to severe respiratory failure   Ventilator Sedation RASS 0 to -2      ASSESSMENT AND PLAN SYNOPSIS   Severe ACUTE Hypoxic and Hypercapnic Respiratory Failure from RUL pneumonia and severe sepsis with very severe COPD exacerbation   Patient with acute cardiac arrest CPR/ACLS for approx 10 mins     DAUGHTER NOTIFIED OF POOR PROGNOSIS, PATIENT HAS PASSED  CAUSE OF DEATH PNEUMONIA COPD   Critical Care Time devoted to patient care services described in this note is 35 minutes.   Lucie Leather, M.D.  [facility]  Medical Director [facility] Medical Director [facility] Cardio-Pulmonary Department

## 2019-05-15 NOTE — Progress Notes (Signed)
Pt arrived from ED with some feces on buttocks, pt was bathed and placed on bair hugger. Pt went into pulseless v-tach; no overhead page due to Dr. Belia Heman at bedside. CPR/ACLS initiated, see code sheet for further. Time of death 17:25.

## 2019-05-15 NOTE — ED Notes (Signed)
Upon RN and RT entering room to transport pt upstairs pt was lethargic, unresponsive, not following commands. This RN informed admitting MD and EDP that intubation was required at this time. Emergency equipment brought to room.

## 2019-05-15 NOTE — ED Notes (Addendum)
Date and time results received: 05/11/19 1632   Test: abg ph Critical Value: 7.04  Name of Provider Notified: Dr. Jaynie Collins

## 2019-05-15 NOTE — Progress Notes (Signed)
RT assisted with patient transport from ER to ICU while patient on the trilogy ventilator.

## 2019-05-15 DEATH — deceased

## 2020-01-30 IMAGING — CT CT ANGIO CHEST
2 of 6 series · 16 of 46 positions shown · IV contrast (APPLIED)
Comparison: Chest radiograph from the same day and CT chest dated
09/06/2014.
COMPARISON: Chest radiograph from the same day and CT chest dated
09/06/2014.

Addendum:
CLINICAL DATA: Respiratory failure, history of COPD.

EXAM:
CT ANGIOGRAPHY CHEST WITH CONTRAST
TECHNIQUE: Multidetector CT imaging of the chest was performed using the
standard protocol during bolus administration of intravenous
contrast. Multiplanar CT image reconstructions and MIPs were
obtained to evaluate the vascular anatomy.
CONTRAST:  75mL OMNIPAQUE IOHEXOL 350 MG/ML SOLN

[Series 5: thins · axial · 0.71mm/px · z∈[-330,-19]mm · 13 of 341 slices shown]
[im 15/341  lung]
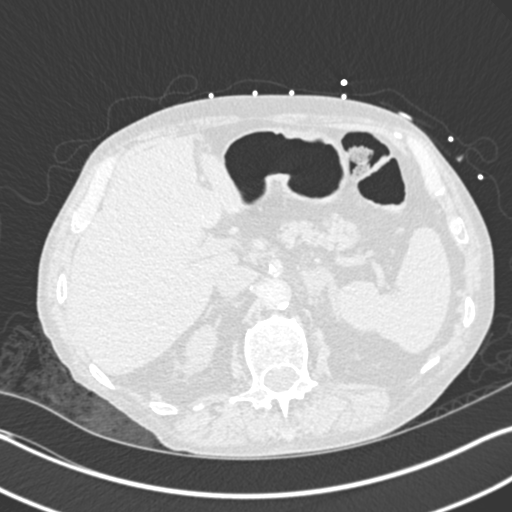
[im 45/341  soft-tissue]
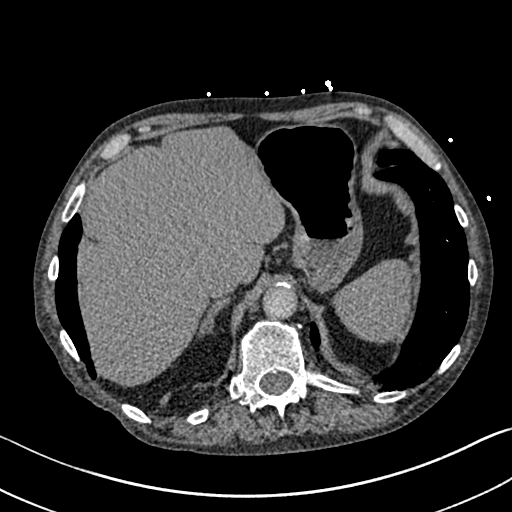
[im 74/341  lung]
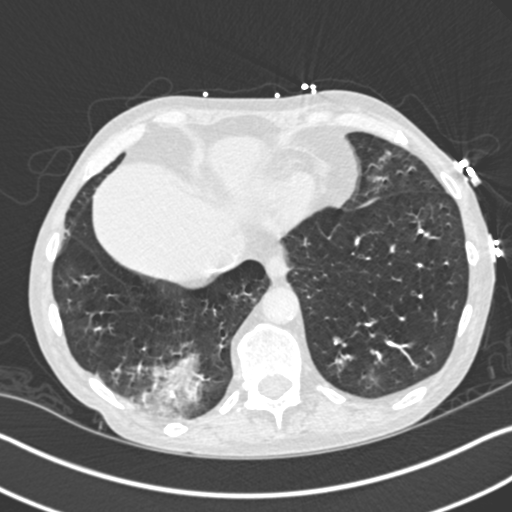
[im 89/341  soft-tissue]
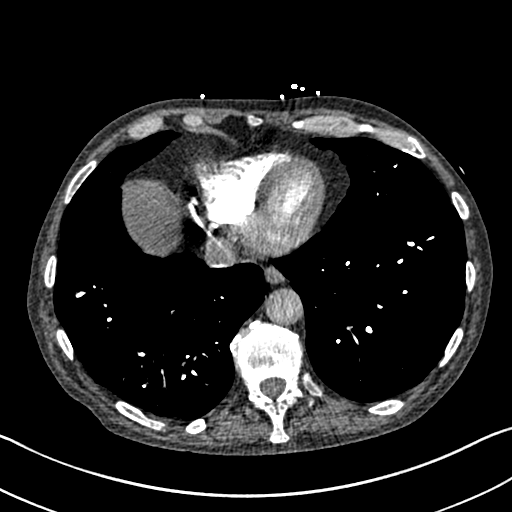
[im 119/341  lung]
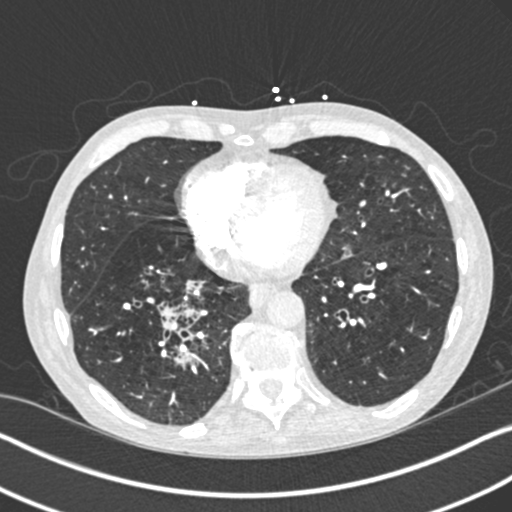
[im 148/341  soft-tissue]
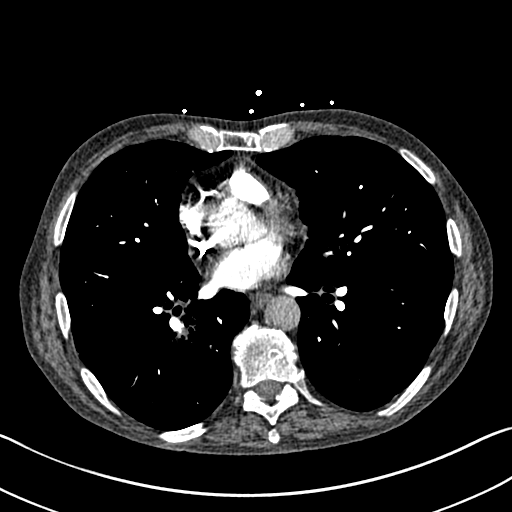
[im 178/341  lung]
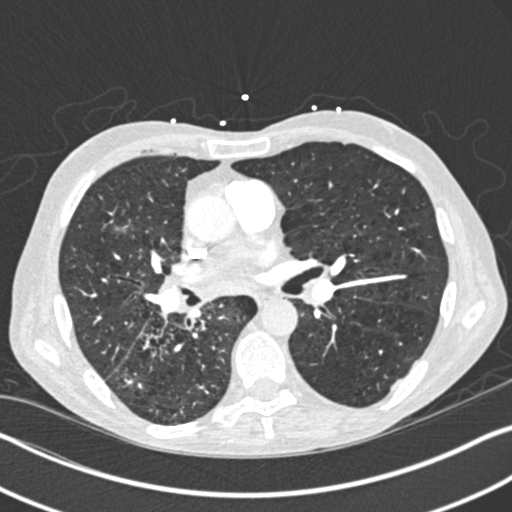
[im 193/341  soft-tissue]
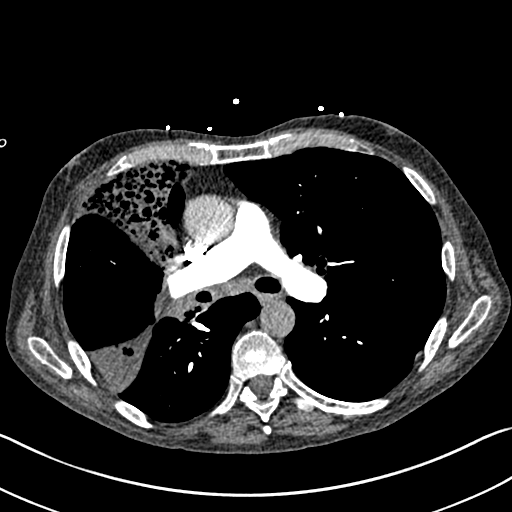
[im 222/341  lung]
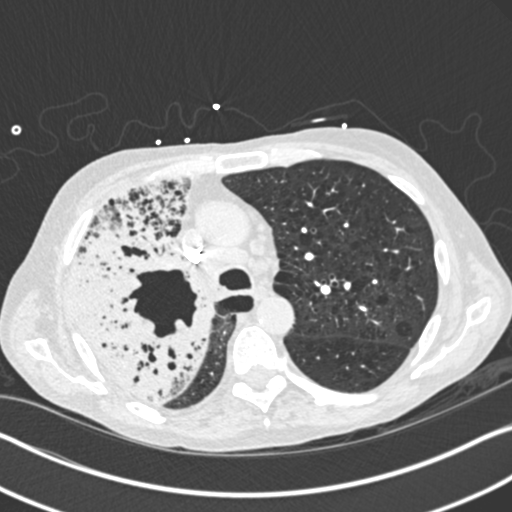
[im 252/341  soft-tissue]
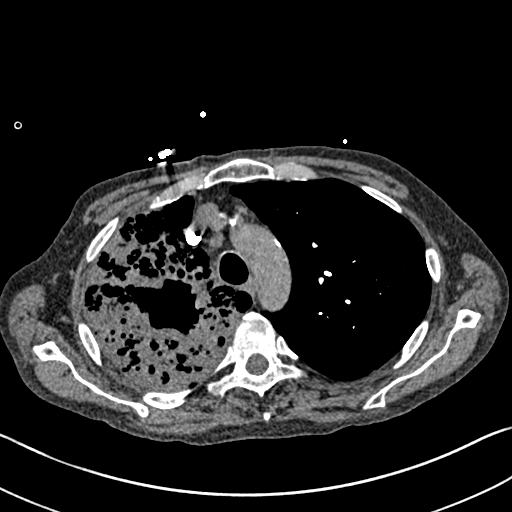
[im 267/341  lung]
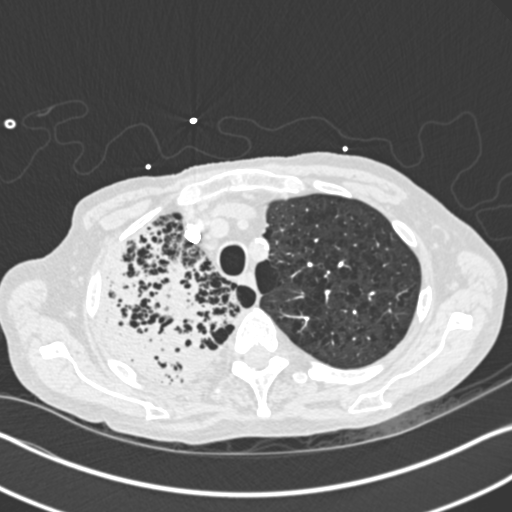
[im 296/341  soft-tissue]
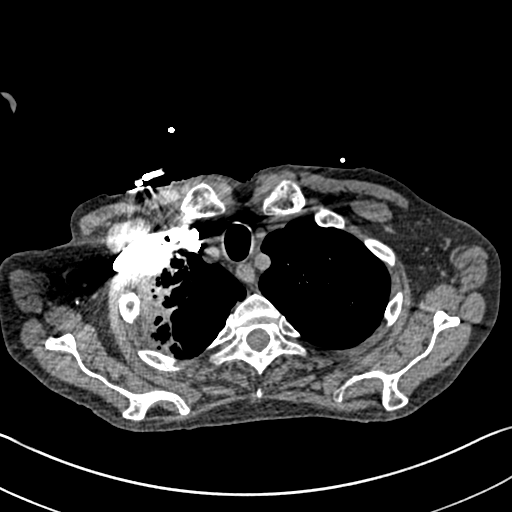
[im 326/341  lung]
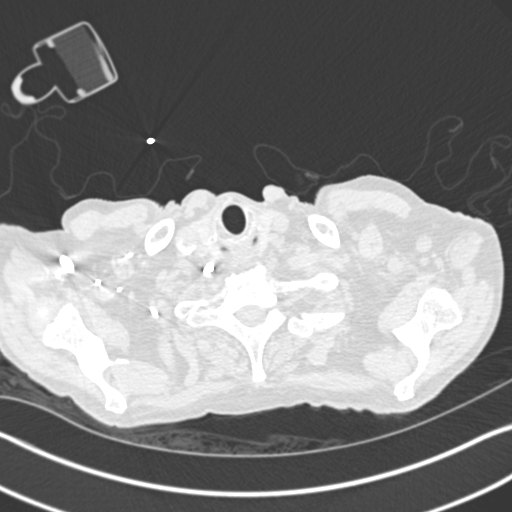

[Series 7: coronal mpr · coronal · 0.67mm/px · 3 of 87 slices shown]
[im 22/87  soft-tissue]
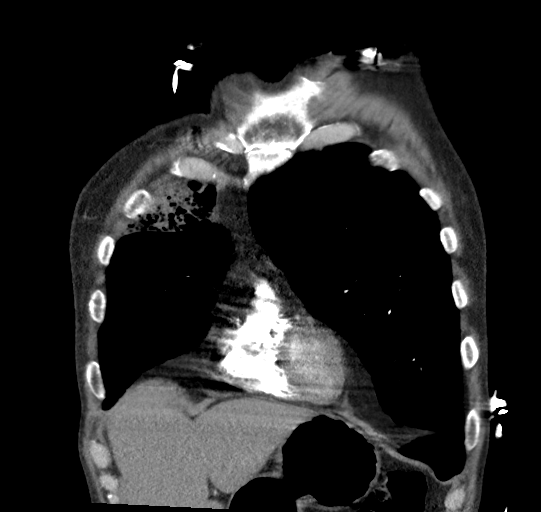
[im 44/87  soft-tissue]
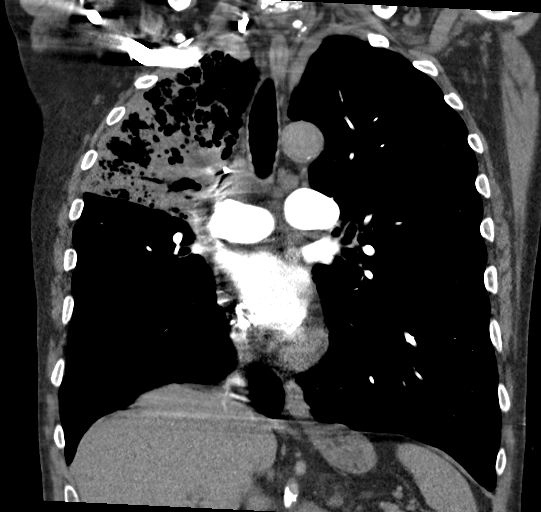
[im 65/87  soft-tissue]
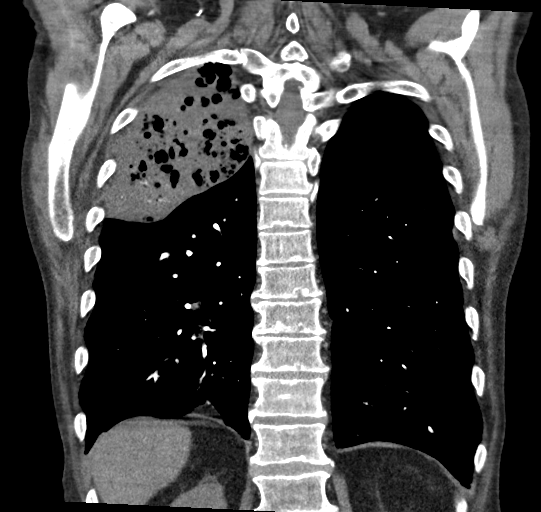

[16 of 46 positions shown; findings below may reference images not displayed]

FINDINGS: Cardiovascular: Satisfactory opacification of the pulmonary arteries
to the segmental level. There is occlusion of the superior trunk of
the right pulmonary artery near its origin likely due to the
associated cavitary mass. Otherwise, no evidence of pulmonary
embolism. Normal heart size. Vascular calcifications are seen in the
aortic arch. No pericardial effusion.

Mediastinum/Nodes: There are several enlarged paratracheal lymph
nodes. Multiple additional prominent mediastinal nodes are
identified. Evaluation right hilar adenopathy is difficult due to
the cavitary mass/consolidation in this area. There is no left hilar
adenopathy. No enlarged axillary lymph nodes. Thyroid gland,
trachea, and esophagus demonstrate no significant findings.

Lungs/Pleura: There is a large cavitary lesion in the right upper
lobe which measures at least 6.5 cm in diameter. This communicates
directly with the right mainstem bronchus. There is a prominent soft
tissue surrounding the right mainstem bronchus which is incontinuity
with the cavitary lesion. Consolidation involves the majority of the
right upper lobe. Additional ground-glass nodules are seen in both
lungs. There is no pleural effusion or pneumothorax. Centrilobular
emphysema is noted.

Upper Abdomen: No acute abnormality.

Musculoskeletal: No chest wall abnormality. No acute or significant
osseous findings.

Review of the MIP images confirms the above findings.
IMPRESSION: 1. Large cavitary lesion/mass in the right upper lobe with direct
communication to the right mainstem bronchus. Surrounding
consolidation involves the majority of the right upper lobe.
Additional ground-glass nodules are seen in both lungs. Mediastinal
lymphadenopathy is noted. These findings are concerning for a
cavitary pneumonia or a cavitary malignancy. Bronchoscopy should be
considered.
2. Occlusion of the superior trunk of the right pulmonary artery
near its origin likely due to the associated cavitary
mass/consolidation. Otherwise, no evidence of pulmonary embolus.

Aortic Atherosclerosis (3MA43-D0V.V) and Emphysema (3MA43-NHA.P).

ADDENDUM:
These results were called by telephone at the time of interpretation
on 04/23/2019 at [DATE] to provider CARLOS FERREIRA BRETZKE , who verbally
acknowledged these results.

*** End of Addendum ***
FINDINGS: Cardiovascular: Satisfactory opacification of the pulmonary arteries
to the segmental level. There is occlusion of the superior trunk of
the right pulmonary artery near its origin likely due to the
associated cavitary mass. Otherwise, no evidence of pulmonary
embolism. Normal heart size. Vascular calcifications are seen in the
aortic arch. No pericardial effusion.

Mediastinum/Nodes: There are several enlarged paratracheal lymph
nodes. Multiple additional prominent mediastinal nodes are
identified. Evaluation right hilar adenopathy is difficult due to
the cavitary mass/consolidation in this area. There is no left hilar
adenopathy. No enlarged axillary lymph nodes. Thyroid gland,
trachea, and esophagus demonstrate no significant findings.

Lungs/Pleura: There is a large cavitary lesion in the right upper
lobe which measures at least 6.5 cm in diameter. This communicates
directly with the right mainstem bronchus. There is a prominent soft
tissue surrounding the right mainstem bronchus which is incontinuity
with the cavitary lesion. Consolidation involves the majority of the
right upper lobe. Additional ground-glass nodules are seen in both
lungs. There is no pleural effusion or pneumothorax. Centrilobular
emphysema is noted.

Upper Abdomen: No acute abnormality.

Musculoskeletal: No chest wall abnormality. No acute or significant
osseous findings.

Review of the MIP images confirms the above findings.
IMPRESSION: 1. Large cavitary lesion/mass in the right upper lobe with direct
communication to the right mainstem bronchus. Surrounding
consolidation involves the majority of the right upper lobe.
Additional ground-glass nodules are seen in both lungs. Mediastinal
lymphadenopathy is noted. These findings are concerning for a
cavitary pneumonia or a cavitary malignancy. Bronchoscopy should be
considered.
2. Occlusion of the superior trunk of the right pulmonary artery
near its origin likely due to the associated cavitary
mass/consolidation. Otherwise, no evidence of pulmonary embolus.

Aortic Atherosclerosis (3MA43-D0V.V) and Emphysema (3MA43-NHA.P).

## 2020-01-30 IMAGING — DX DG CHEST 1V PORT
1 series · 1 of 1 positions shown · non-contrast
Comparison: Chest x-ray April 23, 2019 and CT of the chest
April 23, 2019

CLINICAL DATA: New intubation.

EXAM:
PORTABLE CHEST 1 VIEW

[chest ap]
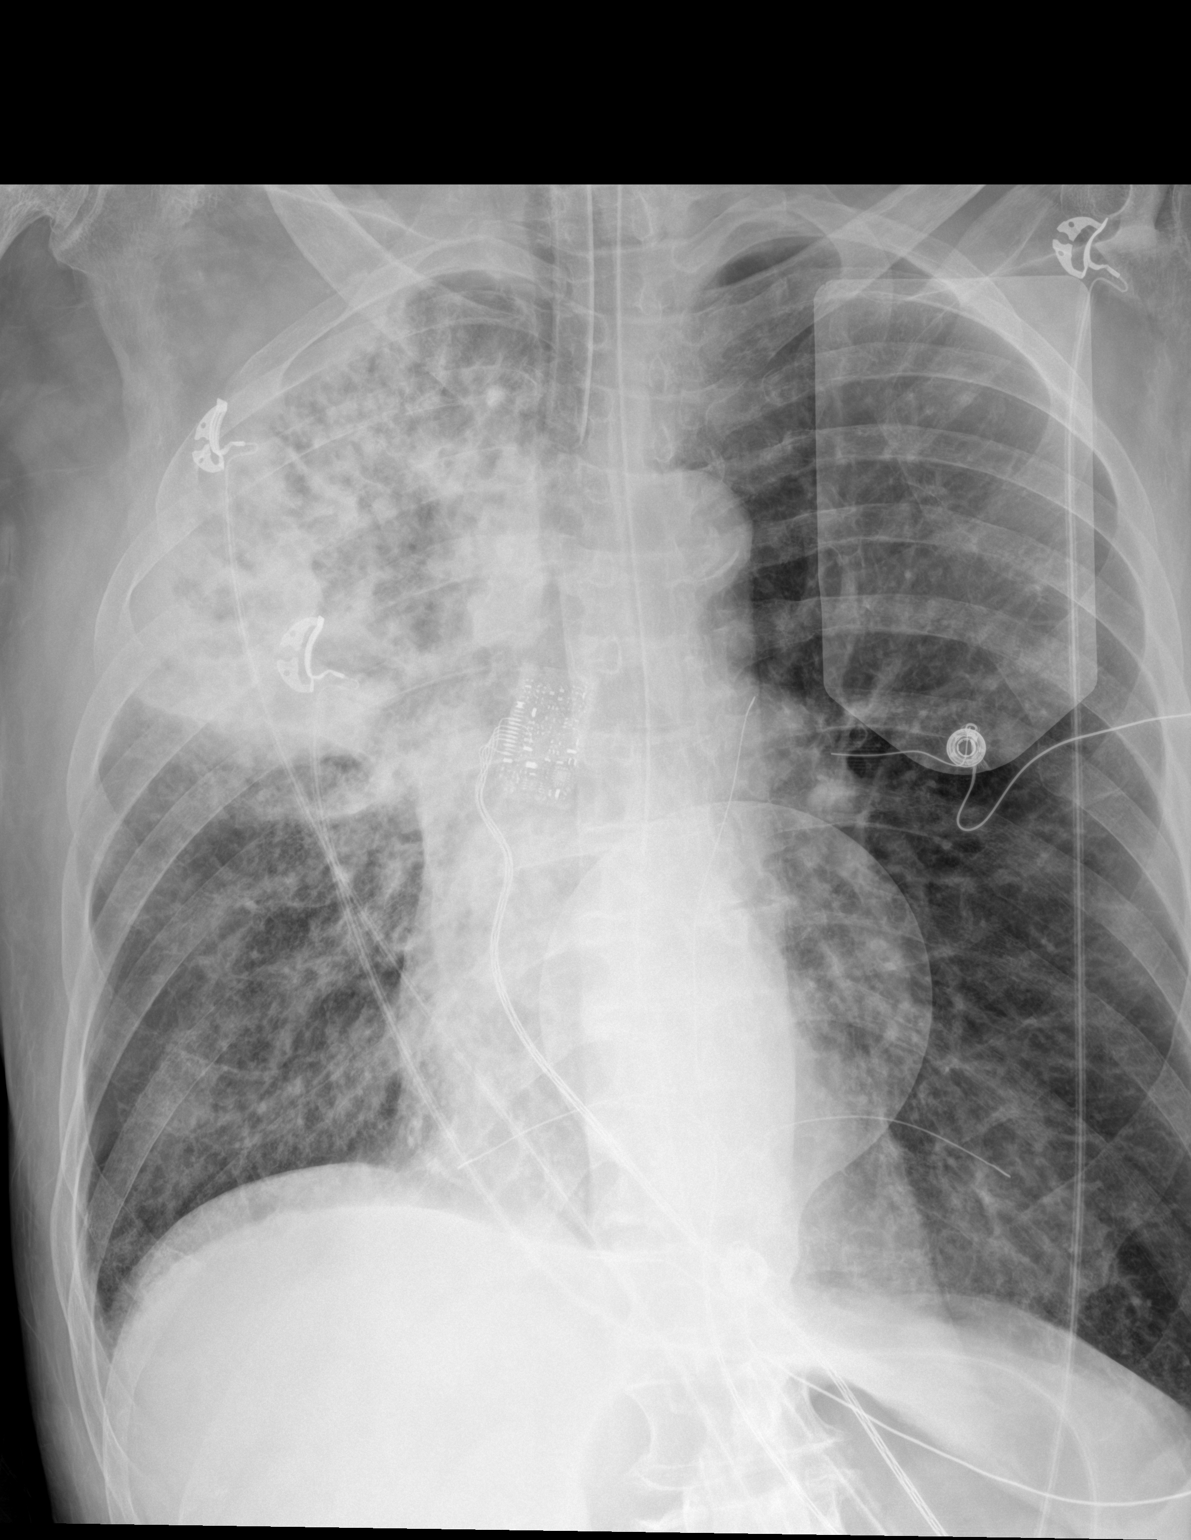

[1 of 1 positions shown; findings below may reference images not displayed]

FINDINGS: An ET tube is been placed in the interval in terminates 7.2 cm above
the carina, in good position. The NG tube terminates below today's
film. Infiltrate in the right upper lobe is again identified. The
known cavitary mass seen on recent CT scan is better appreciated
with CT imaging. The left lung is clear. The cardiomediastinal
silhouette is stable.
IMPRESSION: 1. Support apparatus as above.
2. Continued infiltrate in the right upper lobe. The known cavitary
mass was better assessed with CT imaging.
